# Patient Record
Sex: Male | Born: 1995 | Race: Black or African American | Hispanic: No | Marital: Single | Smoking: Never smoker
Health system: Southern US, Community
[De-identification: ages and names within clinical notes are randomized; demographics above are authoritative.]

---

## 2016-01-23 ENCOUNTER — Emergency Department (HOSPITAL_BASED_OUTPATIENT_CLINIC_OR_DEPARTMENT_OTHER)
Admission: EM | Admit: 2016-01-23 | Discharge: 2016-01-23 | Disposition: A | Discharge status: Home / Self Care | Payer: BC Managed Care – PPO | Attending: Emergency Medicine | Admitting: Emergency Medicine

## 2016-01-23 ENCOUNTER — Encounter (HOSPITAL_BASED_OUTPATIENT_CLINIC_OR_DEPARTMENT_OTHER): Payer: Self-pay | Admitting: Emergency Medicine

## 2016-01-23 DIAGNOSIS — Z202 Contact with and (suspected) exposure to infections with a predominantly sexual mode of transmission: Secondary | ICD-10-CM | POA: Diagnosis not present

## 2016-01-23 MED ORDER — AZITHROMYCIN 250 MG PO TABS
1000.0000 mg | ORAL_TABLET | Freq: Once | ORAL | Status: AC
  Administered 2016-01-23: 1000 mg via ORAL
  Filled 2016-01-23: qty 4

## 2016-01-23 MED ORDER — CEFTRIAXONE SODIUM 250 MG IJ SOLR
250.0000 mg | Freq: Once | INTRAMUSCULAR | Status: AC
  Administered 2016-01-23: 250 mg via INTRAMUSCULAR
  Filled 2016-01-23: qty 250

## 2016-01-23 MED ORDER — VALACYCLOVIR HCL 1 G PO TABS: 1000.0000 mg | ORAL_TABLET | Freq: Three times a day (TID) | ORAL | 0 refills | Status: AC

## 2016-01-23 MED ORDER — LIDOCAINE HCL (PF) 1 % IJ SOLN
INTRAMUSCULAR | Status: AC
  Administered 2016-01-23: 1 mL
  Filled 2016-01-23: qty 5

## 2016-01-23 NOTE — ED Notes (Signed)
States," This girl I mess this told me she has chlamydia "

## 2016-01-23 NOTE — Discharge Instructions (Signed)
Valtrex as prescribed.  We will call you if your cultures indicate you require further treatment.  No sexual contact for the next 2 weeks.

## 2016-01-23 NOTE — ED Provider Notes (Addendum)
[facility]   CSN: [id number] Arrival date & time: 01/23/16  1252     History   Chief Complaint Chief Complaint  Patient presents with  . Exposure to STD    HPI [facility] is a 20 y.o. male.  Patient is a 20 year old male with no significant past medical history. He presents for evaluation of STD exposure. He reports his girlfriend called to tell him she had chlamydia and that he needed to be checked. He denies to me that he is having any discharge or burning with urination, however does report bumps on the base of his penis. He reports these as nonpainful.      History reviewed. No pertinent past medical history.  There are no active problems to display for this patient.   History reviewed. No pertinent surgical history.     Home Medications    Prior to Admission medications   Not on File    Family History History reviewed. No pertinent family history.  Social History Social History  Substance Use Topics  . Smoking status: Never Smoker  . Smokeless tobacco: Never Used  . Alcohol use No     Allergies   Patient has no known allergies.   Review of Systems Review of Systems  All other systems reviewed and are negative.    Physical Exam Updated Vital Signs BP 123/70 (BP Location: Right Arm)   Pulse 80   Temp 98.5 F (36.9 C) (Oral)   Resp 18   Ht 6\' 2"  (1.88 m)   Wt 202 lb (91.6 kg)   SpO2 100%   BMI 25.94 kg/m   Physical Exam  Constitutional: He is oriented to person, place, and time. He appears well-developed and well-nourished. No distress.  HENT:  Head: Normocephalic and atraumatic.  Neck: Normal range of motion. Neck supple.  Genitourinary:  Genitourinary Comments: There is no urethral discharge noted. There are several small vesicular lesions noted to the base of the penis to the left.  Neurological: He is alert and oriented to person, place, and time.  Skin: Skin is warm and dry. He is not diaphoretic.   Nursing note and vitals reviewed.    ED Treatments / Results  Labs (all labs ordered are listed, but only abnormal results are displayed) Labs Reviewed  HSV CULTURE AND TYPING  GC/CHLAMYDIA PROBE AMP ([place]) NOT AT [facility]    EKG  EKG Interpretation None       Radiology No results found.  Procedures Procedures (including critical care time)  Medications Ordered in ED Medications  cefTRIAXone (ROCEPHIN) injection 250 mg (not administered)  azithromycin (ZITHROMAX) tablet 1,000 mg (not administered)  lidocaine (PF) (XYLOCAINE) 1 % injection (not administered)     Initial Impression / Assessment and Plan / ED Course  I have reviewed the triage vital signs and the nursing notes.  Pertinent labs & imaging results that were available during my care of the patient were reviewed by me and considered in my medical decision making (see chart for details).  Clinical Course     [facility] and chlamydia presumptively. Cultures for this were sent. The vesicular lesions to the base of the penis are highly suspicious for herpes. I will prescribe Valtrex for this.Viral culture pending as well.  Final Clinical Impressions(s) / ED Diagnoses   Final diagnoses:  None    New Prescriptions New Prescriptions   No medications on file     Geoffery Lyonsouglas Varnika Butz, MD 01/23/16 1334  Geoffery Lyonsouglas Lore Polka, MD 01/23/16 [phone]

## 2016-01-23 NOTE — ED Notes (Signed)
Patient denies pain and is resting comfortably.  

## 2016-01-23 NOTE — ED Triage Notes (Signed)
Patient states that one of "his girls" said that she tested positive for chlamydia. Came to be checked. Reports that he has little bumps on his penis

## 2016-01-25 LAB — GC/CHLAMYDIA PROBE AMP (~~LOC~~) NOT AT ARMC
CHLAMYDIA, DNA PROBE: POSITIVE — AB
Neisseria Gonorrhea: NEGATIVE

## 2016-01-25 LAB — HSV CULTURE AND TYPING

## 2018-12-21 ENCOUNTER — Emergency Department (HOSPITAL_BASED_OUTPATIENT_CLINIC_OR_DEPARTMENT_OTHER)
Admission: EM | Admit: 2018-12-21 | Discharge: 2018-12-21 | Disposition: A | Discharge status: Home / Self Care | Payer: Self-pay | Attending: Emergency Medicine | Admitting: Emergency Medicine

## 2018-12-21 ENCOUNTER — Emergency Department (HOSPITAL_BASED_OUTPATIENT_CLINIC_OR_DEPARTMENT_OTHER): Payer: Self-pay

## 2018-12-21 ENCOUNTER — Other Ambulatory Visit: Payer: Self-pay

## 2018-12-21 ENCOUNTER — Encounter (HOSPITAL_BASED_OUTPATIENT_CLINIC_OR_DEPARTMENT_OTHER): Payer: Self-pay | Admitting: Student

## 2018-12-21 DIAGNOSIS — R05 Cough: Secondary | ICD-10-CM | POA: Insufficient documentation

## 2018-12-21 DIAGNOSIS — R059 Cough, unspecified: Secondary | ICD-10-CM

## 2018-12-21 DIAGNOSIS — R0602 Shortness of breath: Secondary | ICD-10-CM | POA: Insufficient documentation

## 2018-12-21 LAB — BASIC METABOLIC PANEL
Anion gap: 11 (ref 5–15)
BUN: 12 mg/dL (ref 6–20)
CO2: 22 mmol/L (ref 22–32)
Calcium: 9.2 mg/dL (ref 8.9–10.3)
Chloride: 105 mmol/L (ref 98–111)
Creatinine, Ser: 1.01 mg/dL (ref 0.61–1.24)
GFR calc Af Amer: >60 mL/min (ref >60)
GFR calc non Af Amer: >60 mL/min (ref >60)
Glucose, Bld: 86 mg/dL (ref 70–99)
Potassium: 3.6 mmol/L (ref 3.5–5.1)
Sodium: 138 mmol/L (ref 135–145)

## 2018-12-21 LAB — CBC WITH DIFFERENTIAL/PLATELET
Abs Immature Granulocytes: 0.02 10*3/uL (ref 0.00–0.07)
Basophils Absolute: 0 10*3/uL (ref 0.0–0.1)
Basophils Relative: 0 %
Eosinophils Absolute: 0.2 10*3/uL (ref 0.0–0.5)
Eosinophils Relative: 2 %
HCT: 45.1 % (ref 39.0–52.0)
Hemoglobin: 14.3 g/dL (ref 13.0–17.0)
Immature Granulocytes: 0 %
Lymphocytes Relative: 27 %
Lymphs Abs: 1.9 10*3/uL (ref 0.7–4.0)
MCH: 26.6 pg (ref 26.0–34.0)
MCHC: 31.7 g/dL (ref 30.0–36.0)
MCV: 83.8 fL (ref 80.0–100.0)
Monocytes Absolute: 0.4 10*3/uL (ref 0.1–1.0)
Monocytes Relative: 5 %
Neutro Abs: 4.6 10*3/uL (ref 1.7–7.7)
Neutrophils Relative %: 66 %
Platelets: 293 10*3/uL (ref 150–400)
RBC: 5.38 MIL/uL (ref 4.22–5.81)
RDW: 13.9 % (ref 11.5–15.5)
WBC: 7 10*3/uL (ref 4.0–10.5)
nRBC: 0 % (ref 0.0–0.2)

## 2018-12-21 LAB — D-DIMER, QUANTITATIVE (NOT AT ARMC): D-Dimer, Quant: 0.3 ug/mL-FEU (ref 0.00–0.50)

## 2018-12-21 MED ORDER — SALINE SPRAY 0.65 % NA SOLN
1.0000 | NASAL | 0 refills | Status: AC | PRN
Start: 2018-12-21 — End: ?

## 2018-12-21 MED ORDER — BENZONATATE 100 MG PO CAPS: 100.0000 mg | ORAL_CAPSULE | Freq: Three times a day (TID) | ORAL | 0 refills | Status: AC

## 2018-12-21 NOTE — Discharge Instructions (Addendum)
You were seen in the emergency department today for coughing up and sneezing blood.  Your work-up was reassuring.  Your D-dimer test to look for a blood clot was normal.  Your labs did not show any significant abnormalities.  Your chest x-ray was normal.  You are sending home with nasal saline spray to use each nostril as needed for congestion/nasal irritation as well as Tessalon to take every 8 hours as needed for coughing.  We have prescribed you new medication(s) today. Discuss the medications prescribed today with your pharmacist as they can have adverse effects and interactions with your other medicines including over the counter and prescribed medications. Seek medical evaluation if you start to experience new or abnormal symptoms after taking one of these medicines, seek care immediately if you start to experience difficulty breathing, feeling of your throat closing, facial swelling, or rash as these could be indications of a more serious allergic reaction  Please follow-up with your primary care provider within 3 days.  Return to the ER for new or worsening symptoms including but not limited to coughing up blood clots, increased trouble breathing, chest pain, passing out, fever, or any other concerns.

## 2018-12-21 NOTE — ED Triage Notes (Signed)
Coughed up bright blood one time today. No sob. Or fever

## 2018-12-21 NOTE — ED Provider Notes (Signed)
[facility] Provider Note   CSN: [id number] Arrival date & time: 12/21/18  1503     History   Chief Complaint Chief Complaint  Patient presents with   Hemoptysis    HPI Brett Roy is a 23 y.o. male without significant past medical hx who presents to the ED with complaints of hemoptysis today. Patient states he has had a few episodes of coughing up blood described as bright to dark red blood with sputum mixed in as well as sneezing with some blood in the sputum. He has had episodes of both coughing & sneezing without blood today as well. Has felt mildly short of breath with this. NO alleviating/aggravating factors. No intervention PTA. 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 Denies fever, sore throat, chest pain, syncope, leg pain/swelling, hemoptysis, recent surgery/trauma, recent long travel, hormone use, personal hx of cancer, or hx of DVT/PE.    HPI  No past medical history on file.  There are no active problems to display for this patient.   No past surgical history on file.      Home Medications    Prior to Admission medications   Medication Sig Start Date End Date Taking? Authorizing Provider  valACYclovir (VALTREX) 1000 MG tablet Take 1 tablet (1,000 mg total) by mouth 3 (three) times daily. 01/23/16   Veryl Speak, MD    Family History No family history on file.  Social History Social History   Tobacco Use   Smoking status: Never Smoker   Smokeless tobacco: Never Used  Substance Use Topics   Alcohol use: No   Drug use: No     Allergies   Patient has no known allergies.   Review of Systems Review of Systems  Constitutional: Negative for chills and fever.  HENT: Positive for congestion. Negative for ear pain, sore throat and trouble swallowing.        + for sneezing.   Respiratory: Positive for cough and shortness of breath.   Cardiovascular: Negative for chest pain  and leg swelling.  Musculoskeletal: Negative for myalgias.  Neurological: Negative for syncope.  All other systems reviewed and are negative.    Physical Exam Updated Vital Signs BP (!) 138/92 (BP Location: Right Arm)    Pulse 87    Temp 98.4 F (36.9 C) (Oral)    Resp 16    Ht 6\' 2"  (1.88 m)    Wt 122.5 kg    SpO2 99%    BMI 34.67 kg/m   Physical Exam Vitals signs and nursing note reviewed.  Constitutional:      General: He is not in acute distress.    Appearance: He is well-developed. He is not toxic-appearing.  HENT:     Head: Normocephalic and atraumatic.     Right Ear: Ear canal normal. Tympanic membrane is not perforated, erythematous or retracted.     Left Ear: Ear canal normal. Tympanic membrane is not perforated, erythematous or retracted.     Nose: Congestion present.     Right Nostril: No epistaxis or septal hematoma.     Left Nostril: No epistaxis or septal hematoma.     Right Sinus: No maxillary sinus tenderness or frontal sinus tenderness.     Left Sinus: No maxillary sinus tenderness or frontal sinus tenderness.     Mouth/Throat:     Mouth: Mucous membranes are moist.     Pharynx: Uvula midline. No oropharyngeal exudate or posterior oropharyngeal  erythema.     Comments: Posterior oropharynx is symmetric appearing. Patient tolerating own secretions without difficulty. No trismus. No drooling. No hot potato voice. No swelling beneath the tongue, submandibular compartment is soft.  Eyes:     General:        Right eye: No discharge.        Left eye: No discharge.     Conjunctiva/sclera: Conjunctivae normal.  Neck:     Musculoskeletal: Neck supple.  Cardiovascular:     Rate and Rhythm: Normal rate and regular rhythm.  Pulmonary:     Effort: Pulmonary effort is normal. No respiratory distress.     Breath sounds: Normal breath sounds. No wheezing, rhonchi or rales.  Chest:     Chest wall: No tenderness.  Abdominal:     General: There is no distension.      Palpations: Abdomen is soft.     Tenderness: There is no abdominal tenderness. There is no guarding or rebound.  Musculoskeletal:        General: No tenderness.     Right lower leg: No edema.     Left lower leg: No edema.  Skin:    General: Skin is warm and dry.     Findings: No rash.  Neurological:     Mental Status: He is alert.     Comments: Clear speech.   Psychiatric:        Behavior: Behavior normal.      ED Treatments / Results  Labs (all labs ordered are listed, but only abnormal results are displayed) Labs Reviewed  BASIC METABOLIC PANEL  CBC WITH DIFFERENTIAL/PLATELET  D-DIMER, QUANTITATIVE (NOT AT [facility])    EKG None  Radiology Dg Chest Port 1 View  Result Date: 12/21/2018 CLINICAL DATA:  Hemoptysis, shortness of breath EXAM: PORTABLE CHEST 1 VIEW COMPARISON:  None. FINDINGS: The heart size and mediastinal contours are within normal limits. Both lungs are clear. The visualized skeletal structures are unremarkable. IMPRESSION: No acute abnormality of the lungs in AP portable projection. Electronically Signed   By: Lauralyn PrimesAlex  Bibbey M.D.   On: 12/21/2018 16:09    Procedures Procedures (including critical care time)  Medications Ordered in ED Medications - No data to display   Initial Impression / Assessment and Plan / ED Course  I have reviewed the triage vital signs and the nursing notes.  Pertinent labs & imaging results that were available during my care of the patient were reviewed by me and considered in my medical decision making (see chart for details).   Patient presents to the emergency department with complaints of coughing/sneezing blood today.  Patient is nontoxic-appearing, in no apparent distress, vitals WNL with the exception of elevated blood pressure, doubt HTN emergency.  No active bleeding on exam.  Mild nasal congestion noted, no sinus tenderness, afebrile, do not suspect acute bacterial sinusitis.  No epistaxis.  No meningismus.  Lungs clear to  auscultation bilaterally, no wheezing. Plan for basic labs, D-dimer, and chest x-ray.  CBC: No leukocytosis or anemia BMP: No significant electrolyte derangement. D-dimer: WNL Chest x-ray: No acute abnormalities.  Patient is low risk Wells, D-dimer WNL, doubt PE. Chest x-ray without infiltrate to suggest pneumonia, pneumothorax, fluid overload, or pneumothorax. Labs overall reassuring. Patient is not hypoxic, he does not appear in respiratory distress.  Unclear definitive etiology, possible viral/allergic congestion/cough with blood-streaked sputum.  Will provide nasal saline spray and Tessalon.  Offered covid testing which he declined. I discussed results, treatment plan, need for follow-up, and  return precautions with the patient. Provided opportunity for questions, patient confirmed understanding and is in agreement with plan.   [facility] Roy was evaluated in Emergency Department on 12/21/2018 for the symptoms described in the history of present illness. He/she was evaluated in the context of the global COVID-19 pandemic, which necessitated consideration that the patient might be at risk for infection with the SARS-CoV-2 virus that causes COVID-19. Institutional protocols and algorithms that pertain to the evaluation of patients at risk for COVID-19 are in a state of rapid change based on information released by regulatory bodies including the CDC and federal and state organizations. These policies and algorithms were followed during the patient's care in the ED.   Final Clinical Impressions(s) / ED Diagnoses   Final diagnoses:  Cough    ED Discharge Orders         Ordered    sodium chloride (OCEAN) 0.65 % SOLN nasal spray  As needed     12/21/18 1643    benzonatate (TESSALON) 100 MG capsule  Every 8 hours     12/21/18 1643           Swayze Pries, [place] R, PA-C 12/21/18 1645    Vanetta Mulders, MD 12/25/18 1821

## 2019-03-25 ENCOUNTER — Emergency Department (HOSPITAL_BASED_OUTPATIENT_CLINIC_OR_DEPARTMENT_OTHER)
Admission: EM | Admit: 2019-03-25 | Discharge: 2019-03-25 | Disposition: A | Discharge status: Home / Self Care | Payer: Self-pay | Attending: Emergency Medicine | Admitting: Emergency Medicine

## 2019-03-25 ENCOUNTER — Encounter (HOSPITAL_BASED_OUTPATIENT_CLINIC_OR_DEPARTMENT_OTHER): Payer: Self-pay | Admitting: Emergency Medicine

## 2019-03-25 ENCOUNTER — Other Ambulatory Visit: Payer: Self-pay

## 2019-03-25 DIAGNOSIS — R1032 Left lower quadrant pain: Secondary | ICD-10-CM | POA: Insufficient documentation

## 2019-03-25 DIAGNOSIS — R112 Nausea with vomiting, unspecified: Secondary | ICD-10-CM | POA: Insufficient documentation

## 2019-03-25 DIAGNOSIS — R197 Diarrhea, unspecified: Secondary | ICD-10-CM | POA: Insufficient documentation

## 2019-03-25 DIAGNOSIS — R509 Fever, unspecified: Secondary | ICD-10-CM | POA: Insufficient documentation

## 2019-03-25 DIAGNOSIS — R1031 Right lower quadrant pain: Secondary | ICD-10-CM | POA: Insufficient documentation

## 2019-03-25 DIAGNOSIS — Z20822 Contact with and (suspected) exposure to covid-19: Secondary | ICD-10-CM | POA: Insufficient documentation

## 2019-03-25 LAB — URINALYSIS, ROUTINE W REFLEX MICROSCOPIC
Bilirubin Urine: NEGATIVE
Glucose, UA: NEGATIVE mg/dL
Hgb urine dipstick: NEGATIVE
Ketones, ur: NEGATIVE mg/dL
Leukocytes,Ua: NEGATIVE
Nitrite: NEGATIVE
Protein, ur: NEGATIVE mg/dL
Specific Gravity, Urine: >1.03 — ABNORMAL HIGH (ref 1.005–1.030)
pH: 6 (ref 5.0–8.0)

## 2019-03-25 LAB — SARS CORONAVIRUS 2 (TAT 6-24 HRS): SARS Coronavirus 2: NEGATIVE

## 2019-03-25 LAB — SARS CORONAVIRUS 2 AG (30 MIN TAT): SARS Coronavirus 2 Ag: NEGATIVE

## 2019-03-25 MED ORDER — ONDANSETRON 4 MG PO TBDP
4.0000 mg | ORAL_TABLET | Freq: Once | ORAL | Status: AC
  Administered 2019-03-25: 4 mg via ORAL
  Filled 2019-03-25: qty 1

## 2019-03-25 MED ORDER — LOPERAMIDE HCL 2 MG PO CAPS
2.0000 mg | ORAL_CAPSULE | Freq: Four times a day (QID) | ORAL | 0 refills | Status: AC | PRN
Start: 2019-03-25 — End: ?

## 2019-03-25 MED ORDER — ONDANSETRON 4 MG PO TBDP: 4.0000 mg | ORAL_TABLET | Freq: Three times a day (TID) | ORAL | 0 refills | Status: AC | PRN

## 2019-03-25 NOTE — ED Provider Notes (Addendum)
[facility] EMERGENCY DEPARTMENT Provider Note   CSN: [id number] Arrival date & time: 03/25/19  0548     History Chief Complaint  Patient presents with  . Emesis    [facility] is a 24 y.o. male.  HPI     This is a 24 year old male with no significant past medical history who presents with nausea, vomiting, and diarrhea.  Patient reports onset of symptoms on Friday.  He states that initially he felt like "something was sitting bad on my stomach."  He had subsequent multiple episodes of nonbilious, nonbloody emesis.  He also reports multiple episodes of nonbloody diarrhea.  He initially started Pepto-Bismol with no relief.  His girlfriend told him to take MiraLAX and he continued to have diarrhea.  He does report fevers during this time.  He states temperature was T-max 102 but came down with fever reducers.  He denies any sick contacts.  Denies any Covid exposures.  Denies any loss of sense of taste or smell.  He reports some abdominal soreness mostly on the sides.  History reviewed. No pertinent past medical history.  There are no problems to display for this patient.   History reviewed. No pertinent surgical history.     History reviewed. No pertinent family history.  Social History   Tobacco Use  . Smoking status: Never Smoker  . Smokeless tobacco: Never Used  Substance Use Topics  . Alcohol use: No  . Drug use: No    Home Medications Prior to Admission medications   Medication Sig Start Date End Date Taking? Authorizing Provider  benzonatate (TESSALON) 100 MG capsule Take 1 capsule (100 mg total) by mouth every 8 (eight) hours. 12/21/18   Petrucelli, Samantha R, PA-C  loperamide (IMODIUM) 2 MG capsule Take 1 capsule (2 mg total) by mouth 4 (four) times daily as needed for diarrhea or loose stools. 03/25/19   Paisli Silfies, Mayer Masker, MD  ondansetron (ZOFRAN ODT) 4 MG disintegrating tablet Take 1 tablet (4 mg total) by mouth every 8 (eight) hours as needed. 03/25/19    Buford Bremer, Mayer Masker, MD  sodium chloride (OCEAN) 0.65 % SOLN nasal spray Place 1 spray into both nostrils as needed for congestion. 12/21/18   Petrucelli, Pleas Koch, PA-C  valACYclovir (VALTREX) 1000 MG tablet Take 1 tablet (1,000 mg total) by mouth 3 (three) times daily. 01/23/16   Geoffery Lyons, MD    Allergies    Patient has no known allergies.  Review of Systems   Review of Systems  Constitutional: Positive for chills and fever.  Respiratory: Negative for shortness of breath.   Cardiovascular: Negative for chest pain.  Gastrointestinal: Positive for abdominal pain, diarrhea, nausea and vomiting.  Genitourinary: Negative for dysuria.  Neurological: Negative for dizziness.  All other systems reviewed and are negative.   Physical Exam Updated Vital Signs BP (!) 167/97 (BP Location: Right Arm)   Pulse 70   Temp 98.2 F (36.8 C)   Resp 16   Ht 1.88 m (6\' 2" )   Wt 122.5 kg   SpO2 100%   BMI 34.67 kg/m   Physical Exam Vitals and nursing note reviewed.  Constitutional:      Appearance: He is well-developed. He is not ill-appearing.  HENT:     Head: Normocephalic and atraumatic.     Mouth/Throat:     Mouth: Mucous membranes are moist.  Eyes:     Pupils: Pupils are equal, round, and reactive to light.  Cardiovascular:     Rate and Rhythm:  Normal rate and regular rhythm.     Heart sounds: Normal heart sounds. No murmur.  Pulmonary:     Effort: Pulmonary effort is normal. No respiratory distress.     Breath sounds: Normal breath sounds. No wheezing.  Abdominal:     General: Bowel sounds are normal.     Palpations: Abdomen is soft.     Tenderness: There is abdominal tenderness. There is no rebound.     Comments: Mild diffuse tenderness upper quadrants in the epigastrium and bilaterally, no rebound or guarding  Musculoskeletal:     Cervical back: Neck supple.     Right lower leg: No edema.     Left lower leg: No edema.  Lymphadenopathy:     Cervical: No cervical  adenopathy.  Skin:    General: Skin is warm and dry.  Neurological:     Mental Status: He is alert and oriented to person, place, and time.  Psychiatric:        Mood and Affect: Mood normal.     ED Results / Procedures / Treatments   Labs (all labs ordered are listed, but only abnormal results are displayed) Labs Reviewed  URINALYSIS, ROUTINE W REFLEX MICROSCOPIC - Abnormal; Notable for the following components:      Result Value   Specific Gravity, Urine >1.030 (*)    All other components within normal limits  SARS CORONAVIRUS 2 AG (30 MIN TAT)  SARS CORONAVIRUS 2 (TAT 6-24 HRS)    EKG None  Radiology No results found.  Procedures Procedures (including critical care time)  Medications Ordered in ED Medications  ondansetron (ZOFRAN-ODT) disintegrating tablet 4 mg (4 mg Oral Given 03/25/19 5974)    ED Course  I have reviewed the triage vital signs and the nursing notes.  Pertinent labs & imaging results that were available during my care of the patient were reviewed by me and considered in my medical decision making (see chart for details).    MDM Rules/Calculators/A&P                      Patient presents with nausea, vomiting, diarrhea, and fever.  He is overall nontoxic and vital signs are notable for blood pressure of 167/97.  Temperature here is 98.2.  He is well-hydrated appearing on exam.  He had some mild epigastric and bilateral upper quadrant tenderness but no signs of peritonitis.  Highly suspect viral etiology.  Given current pandemic, COVID-19 is also consideration.  Patient was given Zofran.  Covid testing was sent.  Will obtain urinalysis to evaluate hydration status.  Given no focal tenderness on exam or signs of peritonitis, will hold off on lab work at this time.  Have low suspicion for appendicitis, bowel obstruction, gallbladder pathology.  6:53 AM Covid testing initially negative.  Will send confirmation.  Patient improved somewhat after Zofran.  He  is able to tolerate fluids without difficulty.  Do not feel he needs further testing at this time.  Will discharge with Zofran and Imodium for symptom management.  SUspect viral etiology.  Kasyn Rolph was evaluated in Emergency Department on 03/25/2019 for the symptoms described in the history of present illness. He was evaluated in the context of the global COVID-19 pandemic, which necessitated consideration that the patient might be at risk for infection with the SARS-CoV-2 virus that causes COVID-19. Institutional protocols and algorithms that pertain to the evaluation of patients at risk for COVID-19 are in a state of rapid change based on information released  by regulatory bodies including the CDC and federal and state organizations. These policies and algorithms were followed during the patient's care in the ED.  Final Clinical Impression(s) / ED Diagnoses Final diagnoses:  Nausea vomiting and diarrhea    Rx / DC Orders ED Discharge Orders         Ordered    ondansetron (ZOFRAN ODT) 4 MG disintegrating tablet  Every 8 hours PRN     03/25/19 0652    loperamide (IMODIUM) 2 MG capsule  4 times daily PRN     03/25/19 6962           Merryl Hacker, MD 03/25/19 9528    Merryl Hacker, MD 03/25/19 [phone]

## 2019-03-25 NOTE — ED Triage Notes (Signed)
Pt arrives with c/o abd pain, fever and vomiting since Friday. States son was sick with similar symptoms and seen in ED on Tuesday. Reports emesis x3 this morning. States dark stools and shortness of breath.

## 2019-03-25 NOTE — Discharge Instructions (Signed)
You were seen today for nausea, vomiting, and diarrhea.  Your initial Covid test is negative.  Repeat confirmatory testing was sent.  You need to quarantine until testing returns.  Make sure that you are wearing a mask.  You will be given Zofran and Imodium for your vomiting and diarrhea.  If you develop any new or worsening symptoms including localizing abdominal pain, you should be reevaluated.

## 2020-01-10 ENCOUNTER — Emergency Department (HOSPITAL_BASED_OUTPATIENT_CLINIC_OR_DEPARTMENT_OTHER)
Admission: EM | Admit: 2020-01-10 | Discharge: 2020-01-10 | Disposition: A | Discharge status: Home / Self Care | Payer: Self-pay | Attending: Emergency Medicine | Admitting: Emergency Medicine

## 2020-01-10 ENCOUNTER — Encounter (HOSPITAL_BASED_OUTPATIENT_CLINIC_OR_DEPARTMENT_OTHER): Payer: Self-pay

## 2020-01-10 ENCOUNTER — Other Ambulatory Visit: Payer: Self-pay

## 2020-01-10 ENCOUNTER — Emergency Department (HOSPITAL_BASED_OUTPATIENT_CLINIC_OR_DEPARTMENT_OTHER): Payer: Self-pay

## 2020-01-10 DIAGNOSIS — U071 COVID-19: Secondary | ICD-10-CM | POA: Insufficient documentation

## 2020-01-10 LAB — RESP PANEL BY RT-PCR (FLU A&B, COVID) ARPGX2
Influenza A by PCR: NEGATIVE
Influenza B by PCR: NEGATIVE
SARS Coronavirus 2 by RT PCR: POSITIVE — AB

## 2020-01-10 MED ORDER — METHYLPREDNISOLONE SODIUM SUCC 125 MG IJ SOLR: 125.0000 mg | Freq: Once | INTRAMUSCULAR | Status: DC | PRN

## 2020-01-10 MED ORDER — SODIUM CHLORIDE 0.9 % IV SOLN
INTRAVENOUS | Status: DC | PRN
  Administered 2020-01-10: 500 mL via INTRAVENOUS

## 2020-01-10 MED ORDER — FAMOTIDINE IN NACL 20-0.9 MG/50ML-% IV SOLN: 20.0000 mg | Freq: Once | INTRAVENOUS | Status: DC | PRN

## 2020-01-10 MED ORDER — DEXAMETHASONE SODIUM PHOSPHATE 10 MG/ML IJ SOLN
10.0000 mg | Freq: Once | INTRAMUSCULAR | Status: AC
  Administered 2020-01-10: 10 mg via INTRAMUSCULAR
  Filled 2020-01-10: qty 1

## 2020-01-10 MED ORDER — ALBUTEROL SULFATE HFA 108 (90 BASE) MCG/ACT IN AERS: 2.0000 | INHALATION_SPRAY | Freq: Once | RESPIRATORY_TRACT | Status: DC | PRN

## 2020-01-10 MED ORDER — DIPHENHYDRAMINE HCL 50 MG/ML IJ SOLN: 50.0000 mg | Freq: Once | INTRAMUSCULAR | Status: DC | PRN

## 2020-01-10 MED ORDER — EPINEPHRINE 0.3 MG/0.3ML IJ SOAJ: 0.3000 mg | Freq: Once | INTRAMUSCULAR | Status: DC | PRN

## 2020-01-10 MED ORDER — SODIUM CHLORIDE 0.9 % IV SOLN
1200.0000 mg | Freq: Once | INTRAVENOUS | Status: AC
  Administered 2020-01-10: 1200 mg via INTRAVENOUS
  Filled 2020-01-10: qty 10

## 2020-01-10 MED ORDER — GUAIFENESIN-CODEINE 100-10 MG/5ML PO SYRP: 5.0000 mL | ORAL_SOLUTION | Freq: Three times a day (TID) | ORAL | 0 refills | Status: AC | PRN

## 2020-01-10 NOTE — ED Triage Notes (Addendum)
Pt reports fever and headaches since yesterday along with sharp pain in his chest and abdomen. Pt states the pain is worse when he coughs. Pt also reports taking medications at home for his fever. Pt also reports n/v yesterday.

## 2020-01-10 NOTE — ED Provider Notes (Signed)
[facility] EMERGENCY DEPARTMENT Provider Note   CSN: [id number] Arrival date & time: 01/10/20  0813     History Chief Complaint  Patient presents with  . Fever    Brett Roy is a 24 y.o. male.  Pt presents to the ED today with fever and cough since yesterday.  He has been tylenol cough and cold as well as ibuprofen.  Pt said he last took ibuprofen about 1-2 hrs pta.  Pt said his abdomen hurts when he coughs.  He has not been covid vaccinated.        History reviewed. No pertinent past medical history.  There are no problems to display for this patient.   History reviewed. No pertinent surgical history.     History reviewed. No pertinent family history.  Social History   Tobacco Use  . Smoking status: Never Smoker  . Smokeless tobacco: Never Used  Substance Use Topics  . Alcohol use: No  . Drug use: No    Home Medications Prior to Admission medications   Medication Sig Start Date End Date Taking? Authorizing Provider  benzonatate (TESSALON) 100 MG capsule Take 1 capsule (100 mg total) by mouth every 8 (eight) hours. 12/21/18   Petrucelli, Samantha R, PA-C  guaiFENesin-codeine (ROBITUSSIN AC) 100-10 MG/5ML syrup Take 5 mLs by mouth 3 (three) times daily as needed for cough. 01/10/20   Jacalyn Lefevre, MD  loperamide (IMODIUM) 2 MG capsule Take 1 capsule (2 mg total) by mouth 4 (four) times daily as needed for diarrhea or loose stools. 03/25/19   Horton, Mayer Masker, MD  ondansetron (ZOFRAN ODT) 4 MG disintegrating tablet Take 1 tablet (4 mg total) by mouth every 8 (eight) hours as needed. 03/25/19   Horton, Mayer Masker, MD  sodium chloride (OCEAN) 0.65 % SOLN nasal spray Place 1 spray into both nostrils as needed for congestion. 12/21/18   Petrucelli, Pleas Koch, PA-C  valACYclovir (VALTREX) 1000 MG tablet Take 1 tablet (1,000 mg total) by mouth 3 (three) times daily. 01/23/16   Geoffery Lyons, MD    Allergies    Patient has no known allergies.  Review of  Systems   Review of Systems  Constitutional: Positive for fever.  Respiratory: Positive for cough.   All other systems reviewed and are negative.   Physical Exam Updated Vital Signs BP 134/84   Pulse 60   Temp 98.3 F (36.8 C) (Oral)   Resp 18   Ht 6\' 2"  (1.88 m)   Wt 95.3 kg   SpO2 100%   BMI 26.96 kg/m   Physical Exam Vitals and nursing note reviewed.  Constitutional:      Appearance: Normal appearance.  HENT:     Head: Normocephalic and atraumatic.     Right Ear: External ear normal.     Left Ear: External ear normal.     Nose: Nose normal.     Mouth/Throat:     Mouth: Mucous membranes are moist.     Pharynx: Oropharynx is clear.  Eyes:     Extraocular Movements: Extraocular movements intact.     Conjunctiva/sclera: Conjunctivae normal.     Pupils: Pupils are equal, round, and reactive to light.  Cardiovascular:     Rate and Rhythm: Normal rate and regular rhythm.     Pulses: Normal pulses.     Heart sounds: Normal heart sounds.  Pulmonary:     Effort: Pulmonary effort is normal.     Breath sounds: Normal breath sounds.  Abdominal:  General: Abdomen is flat. Bowel sounds are normal.     Palpations: Abdomen is soft.  Musculoskeletal:        General: Normal range of motion.     Cervical back: Normal range of motion and neck supple.  Skin:    General: Skin is warm.     Capillary Refill: Capillary refill takes less than 2 seconds.  Neurological:     General: No focal deficit present.     Mental Status: He is alert and oriented to person, place, and time.  Psychiatric:        Mood and Affect: Mood normal.        Behavior: Behavior normal.     ED Results / Procedures / Treatments   Labs (all labs ordered are listed, but only abnormal results are displayed) Labs Reviewed  RESP PANEL BY RT-PCR (FLU A&B, COVID) ARPGX2 - Abnormal; Notable for the following components:      Result Value   SARS Coronavirus 2 by RT PCR POSITIVE (*)    All other components  within normal limits    EKG EKG Interpretation  Date/Time:  Friday January 10 2020 08:27:38 EST Ventricular Rate:  77 PR Interval:    QRS Duration: 85 QT Interval:  353 QTC Calculation: 400 R Axis:   40 Text Interpretation: Sinus rhythm No old tracing to compare Confirmed by Jacalyn Lefevre [phone]) on 01/10/2020 8:46:55 AM   Radiology DG Chest Portable 1 View  Result Date: 01/10/2020 CLINICAL DATA:  Cough. EXAM: PORTABLE CHEST 1 VIEW COMPARISON:  12/21/2018. FINDINGS: Mediastinum and hilar structures normal. Lungs are clear. No pleural effusion or pneumothorax. Heart size normal. No acute bony abnormality. IMPRESSION: No acute cardiopulmonary disease.  Chest is stable from prior exam. Electronically Signed   By: Maisie Fus  Register   On: 01/10/2020 08:53    Procedures Procedures (including critical care time)  Medications Ordered in ED Medications  0.9 %  sodium chloride infusion ( Intravenous Stopped 01/10/20 1226)  diphenhydrAMINE (BENADRYL) injection 50 mg (has no administration in time range)  famotidine (PEPCID) IVPB 20 mg premix (has no administration in time range)  methylPREDNISolone sodium succinate (SOLU-MEDROL) 125 mg/2 mL injection 125 mg (has no administration in time range)  albuterol (VENTOLIN HFA) 108 (90 Base) MCG/ACT inhaler 2 puff (has no administration in time range)  EPINEPHrine (EPI-PEN) injection 0.3 mg (has no administration in time range)  dexamethasone (DECADRON) injection 10 mg (10 mg Intramuscular Given 01/10/20 0948)  casirivimab-imdevimab (REGEN-COV) 1,200 mg in sodium chloride 0.9 % 110 mL IVPB (0 mg Intravenous Stopped 01/10/20 1132)    ED Course  I have reviewed the triage vital signs and the nursing notes.  Pertinent labs & imaging results that were available during my care of the patient were reviewed by me and considered in my medical decision making (see chart for details).    MDM Rules/Calculators/A&P                          Pt  qualified for mab due to his BMI.  He is interested in getting this treatment.  He had no problems during the infusion.  He was watched for an hour after treatment and has had no reactions.  He looks much better.  He is not hypoxic.  He is stable for d/c. 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  He is to return if worse.  F/u at the [facility] post covid clinic as  he has no pcp.  Brett Roy was evaluated in Emergency Department on 01/10/2020 for the symptoms described in the history of present illness. He was evaluated in the context of the global COVID-19 pandemic, which necessitated consideration that the patient might be at risk for infection with the SARS-CoV-2 virus that causes COVID-19. Institutional protocols and algorithms that pertain to the evaluation of patients at risk for COVID-19 are in a state of rapid change based on information released by regulatory bodies including the CDC and federal and state organizations. These policies and algorithms were followed during the patient's care in the ED.  Final Clinical Impression(s) / ED Diagnoses Final diagnoses:  COVID-19    Rx / DC Orders ED Discharge Orders         Ordered    guaiFENesin-codeine (ROBITUSSIN AC) 100-10 MG/5ML syrup  3 times daily PRN        01/10/20 1238           Jacalyn Lefevre, MD 01/10/20 1240

## 2020-01-10 NOTE — Progress Notes (Signed)
Pharmacy COVID-19 Monoclonal Antibody Screening  Brett Roy was identified as being not hospitalized with symptoms from Covid-19 on admission but an incidental positive PCR has been documented.  The patient may qualify for the use of monoclonal antibodies (mAB) for COVID-19 viral infection to prevent worsening symptoms stemming from Covid-19 infection.  The patient was identified based on a positive COVID-19 PCR and not requiring the use of supplemental oxygen at this time.  This patient meets the FDA criteria for Emergency Use Authorization of casirivimab/imdevimab or bamlanivimab/etesevimab.  Has a (+) direct SARS-CoV-2 viral test result  Is NOT hospitalized due to COVID-19  Is within 10 days of symptom onset  Has at least one of the high risk factor(s) for progression to severe COVID-19 and/or hospitalization as defined in EUA.    Additionally: The patient has not had a positive COVID-19 PCR in the last 90 days.  The patient is unvaccinated against COVID-19.  Since the patient is unvaccinated and meets high risk criteria, the patient is eligible for mAB administration.   This eligibility and indication for treatment was discussed with the patient's physician: Jacalyn Lefevre, MD   Plan: Based on the above discussion, it was decided that the patient will receive one dose of the available COVID-19 mAB combination. Pharmacy will coordinate administration timing with patient's nurse. Recommended infusion monitoring parameters communicated to the nursing team.   Gerrit Halls, PharmD Clinical Pharmacist  01/10/2020  9:44 AM

## 2020-01-16 ENCOUNTER — Telehealth: Payer: Self-pay | Admitting: General Practice

## 2020-01-16 NOTE — Telephone Encounter (Signed)
Pt was called per referral from [facility] to make appt w/ [facility]. Unable to lvm

## 2020-07-08 ENCOUNTER — Encounter (HOSPITAL_BASED_OUTPATIENT_CLINIC_OR_DEPARTMENT_OTHER): Payer: Self-pay | Admitting: *Deleted

## 2020-07-08 ENCOUNTER — Emergency Department (HOSPITAL_BASED_OUTPATIENT_CLINIC_OR_DEPARTMENT_OTHER): Payer: Self-pay

## 2020-07-08 ENCOUNTER — Other Ambulatory Visit: Payer: Self-pay

## 2020-07-08 ENCOUNTER — Emergency Department (HOSPITAL_BASED_OUTPATIENT_CLINIC_OR_DEPARTMENT_OTHER)
Admission: EM | Admit: 2020-07-08 | Discharge: 2020-07-09 | Disposition: A | Discharge status: Home / Self Care | Payer: Self-pay | Attending: Emergency Medicine | Admitting: Emergency Medicine

## 2020-07-08 DIAGNOSIS — Z23 Encounter for immunization: Secondary | ICD-10-CM | POA: Insufficient documentation

## 2020-07-08 DIAGNOSIS — S51812A Laceration without foreign body of left forearm, initial encounter: Secondary | ICD-10-CM | POA: Insufficient documentation

## 2020-07-08 DIAGNOSIS — S61429A Laceration with foreign body of unspecified hand, initial encounter: Secondary | ICD-10-CM

## 2020-07-08 DIAGNOSIS — Y92410 Unspecified street and highway as the place of occurrence of the external cause: Secondary | ICD-10-CM | POA: Insufficient documentation

## 2020-07-08 MED ORDER — TETANUS-DIPHTH-ACELL PERTUSSIS 5-2.5-18.5 LF-MCG/0.5 IM SUSY
0.5000 mL | PREFILLED_SYRINGE | Freq: Once | INTRAMUSCULAR | Status: AC
  Administered 2020-07-08: 0.5 mL via INTRAMUSCULAR
  Filled 2020-07-08: qty 0.5
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
## 2020-10-04 ENCOUNTER — Other Ambulatory Visit (HOSPITAL_BASED_OUTPATIENT_CLINIC_OR_DEPARTMENT_OTHER): Payer: Self-pay

## 2020-10-04 ENCOUNTER — Emergency Department (HOSPITAL_BASED_OUTPATIENT_CLINIC_OR_DEPARTMENT_OTHER)
Admission: EM | Admit: 2020-10-04 | Discharge: 2020-10-05 | Disposition: A | Discharge status: Home / Self Care | Payer: No Typology Code available for payment source | Attending: Emergency Medicine | Admitting: Emergency Medicine

## 2020-10-04 ENCOUNTER — Other Ambulatory Visit: Payer: Self-pay

## 2020-10-04 ENCOUNTER — Encounter (HOSPITAL_BASED_OUTPATIENT_CLINIC_OR_DEPARTMENT_OTHER): Payer: Self-pay | Admitting: *Deleted

## 2020-10-04 DIAGNOSIS — Y929 Unspecified place or not applicable: Secondary | ICD-10-CM | POA: Diagnosis not present

## 2020-10-04 DIAGNOSIS — S199XXA Unspecified injury of neck, initial encounter: Secondary | ICD-10-CM | POA: Diagnosis not present
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
## 2020-10-05 ENCOUNTER — Encounter (HOSPITAL_BASED_OUTPATIENT_CLINIC_OR_DEPARTMENT_OTHER): Payer: Self-pay | Admitting: Emergency Medicine

## 2020-10-05 ENCOUNTER — Emergency Department (HOSPITAL_BASED_OUTPATIENT_CLINIC_OR_DEPARTMENT_OTHER): Payer: No Typology Code available for payment source

## 2020-10-05 MED ORDER — LIDOCAINE 5 % EX PTCH
1.0000 | MEDICATED_PATCH | CUTANEOUS | Status: DC
  Administered 2020-10-05: 1 via TRANSDERMAL
  Filled 2020-10-05: qty 1

## 2020-10-05 MED ORDER — NAPROXEN 250 MG PO TABS
500.0000 mg | ORAL_TABLET | Freq: Once | ORAL | Status: AC
  Administered 2020-10-05: 500 mg via ORAL
  Filled 2020-10-05: qty 2

## 2020-10-05 MED ORDER — NAPROXEN 375 MG PO TABS: 375.0000 mg | ORAL_TABLET | Freq: Two times a day (BID) | ORAL | 0 refills | Status: AC

## 2020-10-05 MED ORDER — ACETAMINOPHEN 500 MG PO TABS
1000.0000 mg | ORAL_TABLET | Freq: Once | ORAL | Status: AC
  Administered 2020-10-05: 1000 mg via ORAL
  Filled 2020-10-05: qty 2

## 2020-10-05 MED ORDER — LIDOCAINE 5 % EX PTCH: 1.0000 | MEDICATED_PATCH | CUTANEOUS | 0 refills | Status: AC
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
## 2020-10-05 NOTE — ED Notes (Signed)
Pt A&OX4 ambulatory at d/c with independent steady gait, NAD. Pt verbalized understanding of d/c instructions, prescriptions and follow up care. 
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
